# Patient Record
Sex: Male | Born: 2000
Health system: Southern US, Community
[De-identification: ages and names within clinical notes are randomized; demographics above are authoritative.]

## PROBLEM LIST (undated history)

## (undated) DIAGNOSIS — K30 Functional dyspepsia: Secondary | ICD-10-CM

---
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
## 2022-12-10 ENCOUNTER — Ambulatory Visit: Payer: BC Managed Care – PPO | Admitting: Adult Health

## 2022-12-10 ENCOUNTER — Encounter: Payer: Self-pay | Admitting: Adult Health

## 2022-12-10 VITALS — HR 135 | Temp 98.9°F | Ht 63.0 in | Wt 127.0 lb

## 2022-12-10 DIAGNOSIS — R300 Dysuria: Secondary | ICD-10-CM | POA: Diagnosis not present

## 2022-12-10 DIAGNOSIS — R1084 Generalized abdominal pain: Secondary | ICD-10-CM | POA: Diagnosis not present

## 2022-12-10 LAB — POCT URINALYSIS DIPSTICK (MANUAL)
Leukocytes, UA: NEGATIVE
Nitrite, UA: NEGATIVE
Poct Bilirubin: NEGATIVE
Poct Glucose: NORMAL mg/dL
Poct Ketones: NEGATIVE
Poct Protein: 30 mg/dL — AB
Poct Urobilinogen: NORMAL mg/dL
Spec Grav, UA: 1.025 (ref 1.010–1.025)
pH, UA: 6 (ref 5.0–8.0)

## 2022-12-10 NOTE — Progress Notes (Unsigned)
[facility] [address] [place], [place] [address] Phone: [phone] Fax: [phone]   Office Visit Note  Patient Name: Dylan Jimenez  Date of Birth:2000-05-27  Med Rec number [id number]  Date of Service: 12/12/2022  Patient has no known allergies.  Chief Complaint  Patient presents with   Acute Visit     HPI  Patient reports lower abdominal pain as well as epigastric pain that is intermittent with having bowel movements. He also reports some discomfort with urination, and noticed some blood at the very end of urination. It happened last week, and before that it was a month ago.   He reports 2 BM's a day, regular consistency.  No diarrhea, no nausea or vomiting. Has noticed some faint blood on tissue when wiping.   He is currently sexually active with one male partner, been together over a year. They do not engage in oral or penetrative sex. They are using condoms. He has never been tested for STI's.    Current Medication:  No outpatient encounter medications on file as of 12/10/2022.   No facility-administered encounter medications on file as of 12/10/2022.      Medical History: History reviewed. No pertinent past medical history.   Vital Signs: Pulse (!) 135   Temp 98.9 F (37.2 C) (Tympanic)   Ht 5\' 3"  (1.6 m)   Wt 127 lb (57.6 kg)   SpO2 98%   BMI 22.50 kg/m    Review of Systems  Respiratory:  Negative for cough.   Cardiovascular:  Negative for chest pain.  Gastrointestinal:  Positive for abdominal pain. Negative for diarrhea, nausea and vomiting.  Genitourinary:  Positive for dysuria.    Physical Exam Constitutional:      Appearance: Normal appearance.  HENT:     Head: Normocephalic.     Right Ear: Tympanic membrane normal.     Left Ear: Tympanic membrane normal.     Nose: Nose normal.     Mouth/Throat:     Mouth: Mucous membranes are moist.     Pharynx: No oropharyngeal exudate or posterior oropharyngeal erythema.   Eyes:     General:        Right eye: No discharge.        Left eye: No discharge.     Extraocular Movements: Extraocular movements intact.     Pupils: Pupils are equal, round, and reactive to light.  Cardiovascular:     Rate and Rhythm: Normal rate and regular rhythm.     Pulses: Normal pulses.     Heart sounds: Normal heart sounds. No murmur heard. Pulmonary:     Effort: Pulmonary effort is normal. No respiratory distress.     Breath sounds: Normal breath sounds. No wheezing or rhonchi.  Abdominal:     General: Abdomen is flat. Bowel sounds are normal. There is no distension.     Palpations: There is no mass.     Tenderness: There is no abdominal tenderness. There is no right CVA tenderness, left CVA tenderness, guarding or rebound.     Hernia: No hernia is present.  Musculoskeletal:        General: No swelling or deformity. Normal range of motion.     Cervical back: Normal range of motion.  Skin:    General: Skin is warm and dry.     Capillary Refill: Capillary refill takes less than 2 seconds.  Neurological:     General: No focal deficit present.     Mental Status: He  is alert.     Cranial Nerves: No cranial nerve deficit.     Gait: Gait normal.  Psychiatric:        Mood and Affect: Mood normal.        Behavior: Behavior normal.        Judgment: Judgment normal.    No results found for this or any previous visit (from the past 24 hour(s)).   Assessment/Plan: 1. Generalized abdominal pain Will check labs, encouraged OTC constipation medications to see if that changes the discomfort. .  - CBC w/Diff - Comprehensive metabolic panel - C-reactive protein  2. Dysuria - POCT Urinalysis Dip Manual     General Counseling: Dylan Jimenez verbalizes understanding of the findings of todays visit and agrees with plan of treatment. I have discussed any further diagnostic evaluation that may be needed or ordered today. We also reviewed his medications today. he has been encouraged to  call the office with any questions or concerns that should arise related to todays visit.   Orders Placed This Encounter  Procedures   CBC w/Diff   Comprehensive metabolic panel   C-reactive protein   POCT Urinalysis Dip Manual    No orders of the defined types were placed in this encounter.   Time spent:20 Minutes Time spent includes review of chart, medications, test results, and follow up plan with the patient.    Johnna Acosta AGNP-C Nurse Practitioner

## 2022-12-11 LAB — COMPREHENSIVE METABOLIC PANEL
ALT: 11 [IU]/L (ref 0–44)
AST: 20 [IU]/L (ref 0–40)
Albumin: 4.9 g/dL (ref 4.3–5.2)
Alkaline Phosphatase: 76 [IU]/L (ref 51–125)
BUN/Creatinine Ratio: 13 (ref 9–20)
BUN: 13 mg/dL (ref 6–20)
Bilirubin Total: 0.4 mg/dL (ref 0.0–1.2)
CO2: 24 mmol/L (ref 20–29)
Calcium: 9.7 mg/dL (ref 8.7–10.2)
Chloride: 102 mmol/L (ref 96–106)
Creatinine, Ser: 1.03 mg/dL (ref 0.76–1.27)
Globulin, Total: 2.1 g/dL (ref 1.5–4.5)
Sodium: 142 mmol/L (ref 134–144)
Total Protein: 7 g/dL (ref 6.0–8.5)
eGFR: 107 mL/min/{1.73_m2} (ref >59)

## 2022-12-11 LAB — CBC WITH DIFFERENTIAL/PLATELET
Basophils Absolute: 0.1 10*3/uL (ref 0.0–0.2)
Basos: 1 %
EOS (ABSOLUTE): 0.1 10*3/uL (ref 0.0–0.4)
Eos: 1 %
Hematocrit: 47.6 % (ref 37.5–51.0)
Hemoglobin: 16.2 g/dL (ref 13.0–17.7)
Immature Grans (Abs): 0 10*3/uL (ref 0.0–0.1)
Immature Granulocytes: 1 %
Lymphocytes Absolute: 2.1 10*3/uL (ref 0.7–3.1)
Lymphs: 31 %
MCH: 28.7 pg (ref 26.6–33.0)
MCHC: 34 g/dL (ref 31.5–35.7)
MCV: 84 fL (ref 79–97)
Monocytes Absolute: 0.4 10*3/uL (ref 0.1–0.9)
Monocytes: 7 %
Neutrophils Absolute: 3.9 10*3/uL (ref 1.4–7.0)
Neutrophils: 59 %
Platelets: 346 10*3/uL (ref 150–450)
RBC: 5.64 x10E6/uL (ref 4.14–5.80)
RDW: 12.8 % (ref 11.6–15.4)
WBC: 6.6 10*3/uL (ref 3.4–10.8)

## 2022-12-11 LAB — C-REACTIVE PROTEIN: CRP: 1 mg/L (ref 0–10)

## 2022-12-12 ENCOUNTER — Encounter: Payer: Self-pay | Admitting: Adult Health

## 2023-02-17 ENCOUNTER — Ambulatory Visit: Payer: Self-pay | Admitting: Gastroenterology

## 2023-04-27 ENCOUNTER — Ambulatory Visit
Admission: RE | Admit: 2023-04-27 | Discharge: 2023-04-27 | Discharge status: Home / Self Care | Payer: Self-pay | Source: Ambulatory Visit | Attending: Emergency Medicine

## 2023-04-27 VITALS — BP 131/87 | HR 103 | Temp 98.9°F | Resp 18

## 2023-04-27 DIAGNOSIS — R1084 Generalized abdominal pain: Secondary | ICD-10-CM | POA: Diagnosis not present

## 2023-04-27 DIAGNOSIS — K219 Gastro-esophageal reflux disease without esophagitis: Secondary | ICD-10-CM

## 2023-04-27 MED ORDER — OMEPRAZOLE 20 MG PO CPDR: 20.0000 mg | DELAYED_RELEASE_CAPSULE | Freq: Every day | ORAL | 0 refills | Status: DC

## 2023-04-27 NOTE — ED Provider Notes (Signed)
 [facility]    CSN: [id number] Arrival date & time: 04/27/23  1125      History   Chief Complaint Chief Complaint  Patient presents with   Abdominal Pain    ongoing indigestion with blood in stool,  feeling full quickly, increased gas and burping with food, no appetite, and pain after ingesting any sweets in the upper abdomen. - Entered by patient    HPI Dylan Jimenez is a 23 y.o. male.  Patient presents with 78-month history of abdominal discomfort, bloating, indigestion, decreased appetite.  He took Pepto-Bismol last week; no OTC medications taken today.  No fever, dysuria, hematuria, vomiting, diarrhea, constipation.  Last bowel movement this morning.  Patient was seen at [facility] on 12/10/2022 for generalized abdominal pain and dysuria; urine did not indicate infection and blood work normal.   The history is provided by the patient and medical records.    History reviewed. No pertinent past medical history.  There are no active problems to display for this patient.   History reviewed. No pertinent surgical history.     Home Medications    Prior to Admission medications   Medication Sig Start Date End Date Taking? Authorizing Provider  omeprazole (PRILOSEC) 20 MG capsule Take 1 capsule (20 mg total) by mouth daily for 14 days. 04/27/23 05/11/23 Yes Mickie Bail, NP    Family History History reviewed. No pertinent family history.  Social History Social History   Tobacco Use   Smoking status: Never   Smokeless tobacco: Never  Vaping Use   Vaping status: Never Used  Substance Use Topics   Alcohol use: Never   Drug use: Never     Allergies   Patient has no known allergies.   Review of Systems Review of Systems  Constitutional:  Negative for chills and fever.  Gastrointestinal:  Positive for abdominal pain. Negative for blood in stool, constipation, diarrhea, nausea and vomiting.  Genitourinary:  Negative for dysuria, flank pain  and hematuria.     Physical Exam Triage Vital Signs ED Triage Vitals [04/27/23 1133]  Encounter Vitals Group     BP 131/87     Systolic BP Percentile      Diastolic BP Percentile      Pulse Rate (!) 103     Resp 18     Temp 98.9 F (37.2 C)     Temp src      SpO2 98 %     Weight      Height      Head Circumference      Peak Flow      Pain Score      Pain Loc      Pain Education      Exclude from Growth Chart    No data found.  Updated Vital Signs BP 131/87   Pulse (!) 103   Temp 98.9 F (37.2 C)   Resp 18   SpO2 98%   Visual Acuity Right Eye Distance:   Left Eye Distance:   Bilateral Distance:    Right Eye Near:   Left Eye Near:    Bilateral Near:     Physical Exam Constitutional:      General: He is not in acute distress. HENT:     Mouth/Throat:     Mouth: Mucous membranes are moist.  Cardiovascular:     Rate and Rhythm: Normal rate and regular rhythm.     Heart sounds: Normal heart sounds.  Pulmonary:  Effort: Pulmonary effort is normal. No respiratory distress.     Breath sounds: Normal breath sounds.  Abdominal:     General: Bowel sounds are normal.     Palpations: Abdomen is soft.     Tenderness: There is no abdominal tenderness. There is no right CVA tenderness, left CVA tenderness, guarding or rebound.  Neurological:     Mental Status: He is alert.      UC Treatments / Results  Labs (all labs ordered are listed, but only abnormal results are displayed) Labs Reviewed - No data to display  EKG   Radiology No results found.  Procedures Procedures (including critical care time)  Medications Ordered in UC Medications - No data to display  Initial Impression / Assessment and Plan / UC Course  I have reviewed the triage vital signs and the nursing notes.  Pertinent labs & imaging results that were available during my care of the patient were reviewed by me and considered in my medical decision making (see chart for  details).   Generalized abdominal pain, GERD.  Afebrile and vital signs are stable.  Abdomen is soft and nontender with good bowel sounds.  Treating with 14-day course of omeprazole.  Instructed patient to follow-up with [facility].  ED precautions discussed.  Education provided on abdominal pain and GERD.  He agrees to plan of care.  Final Clinical Impressions(s) / UC Diagnoses   Final diagnoses:  Generalized abdominal pain  Gastroesophageal reflux disease, unspecified whether esophagitis present     Discharge Instructions      Take the omeprazole as directed.    Follow-up with [facility].  Go to the emergency department if you have worsening symptoms.     ED Prescriptions     Medication Sig Dispense Auth. Provider   omeprazole (PRILOSEC) 20 MG capsule Take 1 capsule (20 mg total) by mouth daily for 14 days. 14 capsule Mickie Bail, NP      PDMP not reviewed this encounter.   Mickie Bail, NP 04/27/23 [phone]

## 2023-04-27 NOTE — Discharge Instructions (Addendum)
 Take the omeprazole as directed.    Follow-up with [facility].  Go to the emergency department if you have worsening symptoms.

## 2023-04-27 NOTE — ED Triage Notes (Addendum)
 Patient to Urgent Care with complaints of indigestion/ constipation/ bloating/ gas/ burping after eating/ poor appetite.   Symptoms started 3 months ago. Poor appetite over the last week. Feels like his symptoms are worsening.   No otc meds attempted. Has been avoiding certain foods he believes triggers his symptoms including foods high in sugar.

## 2023-04-30 ENCOUNTER — Encounter: Payer: Self-pay | Admitting: Adult Health

## 2023-04-30 ENCOUNTER — Ambulatory Visit: Admitting: Adult Health

## 2023-04-30 VITALS — BP 138/72 | HR 108 | Temp 98.1°F | Ht 64.0 in | Wt 122.0 lb

## 2023-04-30 DIAGNOSIS — R109 Unspecified abdominal pain: Secondary | ICD-10-CM | POA: Diagnosis not present

## 2023-04-30 DIAGNOSIS — R634 Abnormal weight loss: Secondary | ICD-10-CM | POA: Diagnosis not present

## 2023-04-30 NOTE — Progress Notes (Signed)
 [facility] [address] [place], [place] [address] Phone: [phone] Fax: [phone]   Office Visit Note  Patient Name: Dylan Jimenez  Date of Birth:2000-07-15  Med Rec number [id number]  Date of Service: 04/30/2023  Patient has no known allergies.  Chief Complaint  Patient presents with   Acute Visit     HPI  Patient went to Urgent care 3 days ago, see previous notes.  He describes excess intestinally gas, indigestion, decreased appetite, pain when eating sugar, unable to vomit. He reports he has been on omeprazole for a few days and it has not seemed to help.  He is requesting a GI referral.  Current Medication:  Outpatient Encounter Medications as of 04/30/2023  Medication Sig   omeprazole (PRILOSEC) 20 MG capsule Take 1 capsule (20 mg total) by mouth daily for 14 days.   No facility-administered encounter medications on file as of 04/30/2023.      Medical History: No past medical history on file.   Vital Signs: BP 138/72   Pulse (!) 108   Temp 98.1 F (36.7 C) (Tympanic)   Ht 5\' 4"  (1.626 m)   Wt 122 lb (55.3 kg)   SpO2 98%   BMI 20.94 kg/m    Review of Systems  Constitutional:  Negative for chills, fatigue and fever.  Eyes:  Negative for pain.    Physical Exam Vitals reviewed.  Constitutional:      Appearance: Normal appearance.  HENT:     Head: Normocephalic.  Abdominal:     General: Abdomen is flat. Bowel sounds are normal. There is no distension or abdominal bruit.     Palpations: Abdomen is soft.  Neurological:     Mental Status: He is alert.    Assessment/Plan: 1. Abdominal pain, unspecified abdominal location (Primary) Referral to [facility] for evaluation. Follow up in clinic as needed.   2. Weight loss - Ambulatory referral to Gastroenterology     General Counseling: Ardyth Harps understanding of the findings of todays visit and agrees with plan of treatment. I have discussed any further diagnostic  evaluation that may be needed or ordered today. We also reviewed his medications today. he has been encouraged to call the office with any questions or concerns that should arise related to todays visit.   No orders of the defined types were placed in this encounter.   No orders of the defined types were placed in this encounter.   Time spent:15 Minutes Time spent includes review of chart, medications, test results, and follow up plan with the patient.    Johnna Acosta AGNP-C Nurse Practitioner

## 2023-05-01 ENCOUNTER — Telehealth: Payer: Self-pay

## 2023-05-01 NOTE — Telephone Encounter (Signed)
 PT REQUESTING APPOINTMENT WHEN NEW PT SCHEDULE OPENS

## 2023-05-12 ENCOUNTER — Emergency Department

## 2023-05-12 ENCOUNTER — Encounter: Payer: Self-pay | Admitting: Emergency Medicine

## 2023-05-12 ENCOUNTER — Other Ambulatory Visit: Payer: Self-pay

## 2023-05-12 ENCOUNTER — Inpatient Hospital Stay
Admission: EM | Admit: 2023-05-12 | Discharge: 2023-05-14 | DRG: 440 | Disposition: A | Discharge status: Home / Self Care | Attending: Hospitalist | Admitting: Hospitalist

## 2023-05-12 DIAGNOSIS — K859 Acute pancreatitis without necrosis or infection, unspecified: Secondary | ICD-10-CM | POA: Diagnosis not present

## 2023-05-12 DIAGNOSIS — R748 Abnormal levels of other serum enzymes: Secondary | ICD-10-CM

## 2023-05-12 HISTORY — DX: Functional dyspepsia: K30

## 2023-05-12 LAB — COMPREHENSIVE METABOLIC PANEL WITH GFR
ALT: 23 U/L (ref 0–44)
AST: 24 U/L (ref 15–41)
Albumin: 4.5 g/dL (ref 3.5–5.0)
Alkaline Phosphatase: 75 U/L (ref 38–126)
Anion gap: 8 (ref 5–15)
BUN: 9 mg/dL (ref 6–20)
CO2: 27 mmol/L (ref 22–32)
Calcium: 9.4 mg/dL (ref 8.9–10.3)
Chloride: 104 mmol/L (ref 98–111)
Creatinine, Ser: 0.9 mg/dL (ref 0.61–1.24)
GFR, Estimated: >60 mL/min (ref >60)
Glucose, Bld: 86 mg/dL (ref 70–99)
Potassium: 3.9 mmol/L (ref 3.5–5.1)
Sodium: 139 mmol/L (ref 135–145)
Total Bilirubin: 0.8 mg/dL (ref 0.0–1.2)
Total Protein: 7.7 g/dL (ref 6.5–8.1)

## 2023-05-12 LAB — URINALYSIS, ROUTINE W REFLEX MICROSCOPIC
Bilirubin Urine: NEGATIVE
Glucose, UA: NEGATIVE mg/dL
Hgb urine dipstick: NEGATIVE
Ketones, ur: 20 mg/dL — AB
Leukocytes,Ua: NEGATIVE
Nitrite: NEGATIVE
Protein, ur: NEGATIVE mg/dL
Specific Gravity, Urine: >1.046 — ABNORMAL HIGH (ref 1.005–1.030)
pH: 5 (ref 5.0–8.0)

## 2023-05-12 LAB — CBC
HCT: 42.8 % (ref 39.0–52.0)
Hemoglobin: 14.6 g/dL (ref 13.0–17.0)
MCH: 28.1 pg (ref 26.0–34.0)
MCHC: 34.1 g/dL (ref 30.0–36.0)
MCV: 82.3 fL (ref 80.0–100.0)
Platelets: 288 10*3/uL (ref 150–400)
RBC: 5.2 MIL/uL (ref 4.22–5.81)
RDW: 12.9 % (ref 11.5–15.5)
WBC: 5.6 10*3/uL (ref 4.0–10.5)
nRBC: 0 % (ref 0.0–0.2)

## 2023-05-12 LAB — TRIGLYCERIDES: Triglycerides: 55 mg/dL (ref <150)

## 2023-05-12 LAB — LIPASE, BLOOD: Lipase: 1710 U/L — ABNORMAL HIGH (ref 11–51)

## 2023-05-12 MED ORDER — SODIUM CHLORIDE 0.9 % IV SOLN: INTRAVENOUS | Status: DC

## 2023-05-12 MED ORDER — MORPHINE SULFATE (PF) 2 MG/ML IV SOLN: 2.0000 mg | INTRAVENOUS | Status: DC | PRN

## 2023-05-12 MED ORDER — ONDANSETRON HCL 4 MG/2ML IJ SOLN: 4.0000 mg | Freq: Three times a day (TID) | INTRAMUSCULAR | Status: DC | PRN

## 2023-05-12 MED ORDER — ACETAMINOPHEN 325 MG PO TABS: 650.0000 mg | ORAL_TABLET | Freq: Four times a day (QID) | ORAL | Status: DC | PRN

## 2023-05-12 MED ORDER — IOHEXOL 300 MG/ML  SOLN
80.0000 mL | Freq: Once | INTRAMUSCULAR | Status: AC | PRN
  Administered 2023-05-12: 80 mL via INTRAVENOUS

## 2023-05-12 MED ORDER — SODIUM CHLORIDE 0.9 % IV BOLUS
1000.0000 mL | Freq: Once | INTRAVENOUS | Status: AC
  Administered 2023-05-12: 1000 mL via INTRAVENOUS

## 2023-05-12 MED ORDER — OXYCODONE-ACETAMINOPHEN 5-325 MG PO TABS: 1.0000 | ORAL_TABLET | ORAL | Status: DC | PRN

## 2023-05-12 NOTE — ED Triage Notes (Signed)
 Pt in via POV, reports ongoing abdominal pain x a few weeks, w/ decreased appetite and weight loss.  Denies N/V/D, last BM today.

## 2023-05-12 NOTE — ED Notes (Addendum)
 Pt was encouraged to try and give a urine sample. Pt advised he would see if he could go in a little bit after the fluid bolus has finished. Pt also expressed interest in leaving without his results being read, and he was discouraged from same due to the risk of same.

## 2023-05-12 NOTE — ED Notes (Signed)
 See triage notes. Patient stated he has been dealing with abdominal issues for a while. Constipation, bloody stool, pus in his stool.

## 2023-05-12 NOTE — ED Provider Notes (Signed)
 [facility] Provider Note    Event Date/Time   First MD Initiated Contact with Patient 05/12/23 1721     (approximate)   History   Chief Complaint Abdominal Pain   HPI  Dylan Jimenez is a 23 y.o. male with no significant past medical history who presents to the ED complaining of abdominal pain.  Patient reports that he has had about 1 month of relatively mild pain in his epigastrium which she describes as feeling similar to indigestion.  He reports decreased appetite with the symptoms and has lost about 5 pounds over the past month.  He has occasionally noticed blood when he goes to wipe after having a bowel movement, but has not passed any blood into the toilet bowl.  He reports occasional nausea but has not vomited.  Pain has seemed to get acutely worse over the past 3 days and so he presents to the ED for further evaluation.     Physical Exam   Triage Vital Signs: ED Triage Vitals [05/12/23 1352]  Encounter Vitals Group     BP      Systolic BP Percentile      Diastolic BP Percentile      Pulse      Resp      Temp      Temp src      SpO2      Weight 117 lb (53.1 kg)     Height 5\' 4"  (1.626 m)     Head Circumference      Peak Flow      Pain Score 2     Pain Loc      Pain Education      Exclude from Growth Chart     Most recent vital signs: Vitals:   05/12/23 1727 05/12/23 2213  BP: 126/82 (!) 141/86  Pulse: 87 100  Resp: 16 18  Temp: 97.8 F (36.6 C) 98.4 F (36.9 C)  SpO2: 98% 99%    Constitutional: Alert and oriented. Eyes: Conjunctivae are normal. Head: Atraumatic. Nose: No congestion/rhinnorhea. Mouth/Throat: Mucous membranes are moist.  Cardiovascular: Normal rate, regular rhythm. Grossly normal heart sounds.  2+ radial pulses bilaterally. Respiratory: Normal respiratory effort.  No retractions. Lungs CTAB. Gastrointestinal: Soft and tender to palpation in the epigastrium with no rebound or guarding. No  distention. Musculoskeletal: No lower extremity tenderness nor edema.  Neurologic:  Normal speech and language. No gross focal neurologic deficits are appreciated.    ED Results / Procedures / Treatments   Labs (all labs ordered are listed, but only abnormal results are displayed) Labs Reviewed  LIPASE, BLOOD - Abnormal; Notable for the following components:      Result Value   Lipase 1,710 (*)    All other components within normal limits  URINALYSIS, ROUTINE W REFLEX MICROSCOPIC - Abnormal; Notable for the following components:   Color, Urine YELLOW (*)    APPearance CLEAR (*)    Specific Gravity, Urine >1.046 (*)    Ketones, ur 20 (*)    All other components within normal limits  COMPREHENSIVE METABOLIC PANEL WITH GFR  CBC  TRIGLYCERIDES  URINE DRUG SCREEN, QUALITATIVE ([facility] ONLY)    RADIOLOGY CT abdomen/pelvis reviewed and interpreted by me with no inflammatory changes, focal fluid collections, or dilated bowel loops.  PROCEDURES:  Critical Care performed: No  Procedures   MEDICATIONS ORDERED IN ED: Medications  0.9 %  sodium chloride  infusion (has no administration in time range)  ondansetron  (ZOFRAN ) injection  4 mg (has no administration in time range)  acetaminophen  (TYLENOL ) tablet 650 mg (has no administration in time range)  morphine  (PF) 2 MG/ML injection 2 mg (has no administration in time range)  oxyCODONE -acetaminophen  (PERCOCET/ROXICET) 5-325 MG per tablet 1 tablet (has no administration in time range)  sodium chloride  0.9 % bolus 1,000 mL (0 mLs Intravenous Stopped 05/12/23 2024)  iohexol  (OMNIPAQUE ) 300 MG/ML solution 80 mL (80 mLs Intravenous Contrast Given 05/12/23 1833)     IMPRESSION / MDM / ASSESSMENT AND PLAN / ED COURSE  I reviewed the triage vital signs and the nursing notes.                              23 y.o. male with no significant past medical history who presents to the ED with 1 month of upper abdominal pain with decreased appetite,  acutely worse over the past 3 days.  Patient's presentation is most consistent with acute presentation with potential threat to life or bodily function.  Differential diagnosis includes, but is not limited to, gastritis, GERD, pancreatitis, hepatitis, cholecystitis, biliary colic, bowel obstruction.  Patient nontoxic-appearing and in no acute distress, vital signs are unremarkable.  His abdomen is soft but he does have significant tenderness to palpation in his epigastrium.  Lipase elevated at greater than 1700, remainder of labs are reassuring without significant anemia, leukocytosis, electrolyte abnormality, or AKI.  LFTs are also unremarkable, will further assess with CT imaging and add on triglyceride levels.  Patient declines pain or nausea medication.  CT abdomen/pelvis is negative for acute process, triglycerides are unremarkable.  Case discussed with hospitalist for admission for further management of pancreatitis.      FINAL CLINICAL IMPRESSION(S) / ED DIAGNOSES   Final diagnoses:  Acute pancreatitis without infection or necrosis, unspecified pancreatitis type     Rx / DC Orders   ED Discharge Orders     None        Note:  This document was prepared using Dragon voice recognition software and may include unintentional dictation errors.   Twilla Galea, MD 05/12/23 2308

## 2023-05-12 NOTE — H&P (Signed)
 History and Physical    Dylan Jimenez [id number] DOB: Jul 12, 2000 DOA: 05/12/2023  Referring MD/NP/PA:   PCP: Pcp, No   Patient coming from:  The patient is coming from home.     Chief Complaint: Abdominal pain  HPI: Dylan Jimenez is a 23 y.o. male without significant past medical history, who presents with abdominal pain.  Patient states that he has abdominal pain for more than 4 weeks, which has been persistent.  It is located in the left upper quadrant, sharp, moderate, nonradiating, aggravated by eating food, associated with burping.  He has decreased appetite, 5 pound weight loss recently.  No nausea, vomiting, diarrhea.  No fever or chills.  He noticed little bloody mucus in stool occasionally.  Patient does not have chest pain, cough, SOB.  No symptoms of UTI.  Patient denies drinking alcohol.  No drug use.  Patient was seen by [facility], and started on Protonix  without improvement. Patient was given referral to GI, but has not been seen yet.  Data reviewed independently and ED Course: pt was found to have lipase 1710, WBC 5.6, GFR> 60, negative UA, triglyceride level 55.  Temperature normal, blood pressure 141/86, heart rate of 100, RR 18, oxygen saturation 99% on room air.  Patient is placed in MedSurg bed for observation.  CT abdomen/pelvis: No acute or active process within the abdomen or pelvis.  Pancreas is unremarkable.  No pancreatic ductal dilation or biliary duct dilation.   EKG: I have personally reviewed.  Sinus rhythm, QTc 436, no ischemic change.   Review of Systems:   General: no fevers, chills, has poor appetite, has fatigue and weight loss HEENT: no blurry vision, hearing changes or sore throat Respiratory: no dyspnea, coughing, wheezing CV: no chest pain, no palpitations GI: no nausea, vomiting, has abdominal pain, no diarrhea, constipation GU: no dysuria, burning on urination, increased urinary frequency, hematuria   Ext: no leg edema Neuro: no unilateral weakness, numbness, or tingling, no vision change or hearing loss Skin: no rash, no skin tear. MSK: No muscle spasm, no deformity, no limitation of range of movement in spin Heme: No easy bruising.  Travel history: No recent long distant travel.   Allergy: No Known Allergies  Past Medical History:  Diagnosis Date   Indigestion     History reviewed. No pertinent surgical history.  Social History:  reports that he has never smoked. He has never used smokeless tobacco. He reports that he does not drink alcohol and does not use drugs.  Family History: I have reviewed with the patient about his family medical history, but the patient states that all family members do not have significant medical issue.   Prior to Admission medications   Medication Sig Start Date End Date Taking? Authorizing Provider  omeprazole  (PRILOSEC) 20 MG capsule Take 1 capsule (20 mg total) by mouth daily for 14 days. 04/27/23 05/11/23  Wellington Half, NP    Physical Exam: Vitals:   05/12/23 1352 05/12/23 1726 05/12/23 1727 05/12/23 2213  BP:  126/82 126/82 (!) 141/86  Pulse:  91 87 100  Resp:  18 16 18   Temp:  98.1 F (36.7 C) 97.8 F (36.6 C) 98.4 F (36.9 C)  TempSrc:   Oral Oral  SpO2:  100% 98% 99%  Weight: 53.1 kg     Height: 5\' 4"  (1.626 m)      General: Not in acute distress HEENT:       Eyes: PERRL, EOMI, no jaundice  ENT: No discharge from the ears and nose, no pharynx injection, no tonsillar enlargement.        Neck: No JVD, no bruit, no mass felt. Heme: No neck lymph node enlargement. Cardiac: S1/S2, RRR, No murmurs, No gallops or rubs. Respiratory: No rales, wheezing, rhonchi or rubs. GI: Soft, nondistended, has tenderness in left upper quadrant, no rebound pain, no organomegaly, BS present. GU: No hematuria Ext: No pitting leg edema bilaterally. 1+DP/PT pulse bilaterally. Musculoskeletal: No joint deformities, No joint redness or warmth, no  limitation of ROM in spin. Skin: No rashes.  Neuro: Alert, oriented X3, cranial nerves II-XII grossly intact, moves all extremities normally. Psych: Patient is not psychotic, no suicidal or hemocidal ideation.  Labs on Admission: I have personally reviewed following labs and imaging studies  CBC: Recent Labs  Lab 05/12/23 1435  WBC 5.6  HGB 14.6  HCT 42.8  MCV 82.3  PLT 288   Basic Metabolic Panel: Recent Labs  Lab 05/12/23 1435  NA 139  K 3.9  CL 104  CO2 27  GLUCOSE 86  BUN 9  CREATININE 0.90  CALCIUM 9.4   GFR: Estimated Creatinine Clearance: 98.3 mL/min (by C-G formula based on SCr of 0.9 mg/dL). Liver Function Tests: Recent Labs  Lab 05/12/23 1435  AST 24  ALT 23  ALKPHOS 75  BILITOT 0.8  PROT 7.7  ALBUMIN 4.5   Recent Labs  Lab 05/12/23 1435  LIPASE 1,710*   No results for input(s): "AMMONIA" in the last 168 hours. Coagulation Profile: Recent Labs  Lab 05/13/23 0104  INR 1.1   Cardiac Enzymes: No results for input(s): "CKTOTAL", "CKMB", "CKMBINDEX", "TROPONINI" in the last 168 hours. BNP (last 3 results) No results for input(s): "PROBNP" in the last 8760 hours. HbA1C: No results for input(s): "HGBA1C" in the last 72 hours. CBG: No results for input(s): "GLUCAP" in the last 168 hours. Lipid Profile: Recent Labs    05/12/23 1814  TRIG 55   Thyroid Function Tests: No results for input(s): "TSH", "T4TOTAL", "FREET4", "T3FREE", "THYROIDAB" in the last 72 hours. Anemia Panel: No results for input(s): "VITAMINB12", "FOLATE", "FERRITIN", "TIBC", "IRON", "RETICCTPCT" in the last 72 hours. Urine analysis:    Component Value Date/Time   COLORURINE YELLOW (A) 05/12/2023 2015   APPEARANCEUR CLEAR (A) 05/12/2023 2015   APPEARANCEUR Clear 09/30/2013 1510   LABSPEC >1.046 (H) 05/12/2023 2015   LABSPEC 1.030 09/30/2013 1510   PHURINE 5.0 05/12/2023 2015   GLUCOSEU NEGATIVE 05/12/2023 2015   GLUCOSEU Negative 09/30/2013 1510   HGBUR NEGATIVE  05/12/2023 2015   BILIRUBINUR NEGATIVE 05/12/2023 2015   BILIRUBINUR Negative 09/30/2013 1510   KETONESUR 20 (A) 05/12/2023 2015   PROTEINUR NEGATIVE 05/12/2023 2015   NITRITE NEGATIVE 05/12/2023 2015   LEUKOCYTESUR NEGATIVE 05/12/2023 2015   LEUKOCYTESUR Negative 09/30/2013 1510   Sepsis Labs: @LABRCNTIP (procalcitonin:4,lacticidven:4) )No results found for this or any previous visit (from the past 240 hours).   Radiological Exams on Admission:   Assessment/Plan Principal Problem:   Acute pancreatitis   Assessment and Plan:  Acute pancreatitis: Lipase 1710.  CT scan unremarkable, no pancreatic ductal dilation, no biliary ductal dilation.  Etiology is not clear.  Patient denies drinking alcohol.  No drug use.  -Place in MedSurg bed for observation - Supportive care - Pain control: As needed morphine , Percocet, Tylenol  - As needed Zofran  for nausea - IV fluid: 1 L normal saline, then 150 cc/h - Check IgG4 level - Patient will need to follow-up with GI  DVT ppx: SCd  Code Status: Full code    Family Communication:     not done, no family member is at bed side.     Disposition Plan:  Anticipate discharge back to previous environment  Consults called:  none  Admission status and Level of care: Med-Surg:    for obs    Dispo: The patient is from: Home              Anticipated d/c is to: Home              Anticipated d/c date is: 1 day              Patient currently is not medically stable to d/c.    Severity of Illness:  The appropriate patient status for this patient is OBSERVATION. Observation status is judged to be reasonable and necessary in order to provide the required intensity of service to ensure the patient's safety. The patient's presenting symptoms, physical exam findings, and initial radiographic and laboratory data in the context of their medical condition is felt to place them at decreased risk for further clinical deterioration. Furthermore, it is  anticipated that the patient will be medically stable for discharge from the hospital within 2 midnights of admission.        Date of Service 05/13/2023    Fidencio Hue [facility]   If 7PM-7AM, please contact night-coverage [email or web address] 05/13/2023, 2:22 AM

## 2023-05-12 NOTE — ED Provider Triage Note (Signed)
 Emergency Medicine Provider Triage Evaluation Note  Dylan Jimenez , a 23 y.o. male  was evaluated in triage.  Pt complains of chronic abd pain, worsening over last month, bloody stools, upper abdominal pain and weight loss.  Review of Systems  Positive:  Negative:   Physical Exam  Ht 5\' 4"  (1.626 m)   Wt 53.1 kg   BMI 20.08 kg/m  Gen:   Awake, no distress   Resp:  Normal effort  MSK:   Moves extremities without difficulty  Other:    Medical Decision Making  Medically screening exam initiated at 1:58 PM.  Appropriate orders placed.  Ryman Rathgeber was informed that the remainder of the evaluation will be completed by another provider, this initial triage assessment does not replace that evaluation, and the importance of remaining in the ED until their evaluation is complete.     Delsie Figures, PA-C 05/12/23 1359

## 2023-05-13 ENCOUNTER — Encounter: Payer: Self-pay | Admitting: Internal Medicine

## 2023-05-13 DIAGNOSIS — R748 Abnormal levels of other serum enzymes: Secondary | ICD-10-CM | POA: Diagnosis not present

## 2023-05-13 DIAGNOSIS — K859 Acute pancreatitis without necrosis or infection, unspecified: Secondary | ICD-10-CM | POA: Diagnosis present

## 2023-05-13 LAB — CBC
HCT: 38.5 % — ABNORMAL LOW (ref 39.0–52.0)
Hemoglobin: 12.7 g/dL — ABNORMAL LOW (ref 13.0–17.0)
MCH: 27.4 pg (ref 26.0–34.0)
MCHC: 33 g/dL (ref 30.0–36.0)
MCV: 83 fL (ref 80.0–100.0)
Platelets: 268 10*3/uL (ref 150–400)
RBC: 4.64 MIL/uL (ref 4.22–5.81)
RDW: 13 % (ref 11.5–15.5)
WBC: 6.2 10*3/uL (ref 4.0–10.5)
nRBC: 0 % (ref 0.0–0.2)

## 2023-05-13 LAB — URINE DRUG SCREEN, QUALITATIVE (ARMC ONLY)
Amphetamines, Ur Screen: NOT DETECTED
Barbiturates, Ur Screen: NOT DETECTED
Benzodiazepine, Ur Scrn: NOT DETECTED
Cannabinoid 50 Ng, Ur ~~LOC~~: NOT DETECTED
Cocaine Metabolite,Ur ~~LOC~~: NOT DETECTED
MDMA (Ecstasy)Ur Screen: NOT DETECTED
Methadone Scn, Ur: NOT DETECTED
Opiate, Ur Screen: NOT DETECTED
Phencyclidine (PCP) Ur S: NOT DETECTED
Tricyclic, Ur Screen: NOT DETECTED

## 2023-05-13 LAB — BASIC METABOLIC PANEL WITH GFR
Anion gap: 8 (ref 5–15)
BUN: 8 mg/dL (ref 6–20)
CO2: 20 mmol/L — ABNORMAL LOW (ref 22–32)
Calcium: 8.6 mg/dL — ABNORMAL LOW (ref 8.9–10.3)
Chloride: 105 mmol/L (ref 98–111)
Creatinine, Ser: 0.93 mg/dL (ref 0.61–1.24)
GFR, Estimated: >60 mL/min (ref >60)
Glucose, Bld: 90 mg/dL (ref 70–99)
Potassium: 4 mmol/L (ref 3.5–5.1)
Sodium: 133 mmol/L — ABNORMAL LOW (ref 135–145)

## 2023-05-13 LAB — LIPASE, BLOOD: Lipase: 1749 U/L — ABNORMAL HIGH (ref 11–51)

## 2023-05-13 LAB — PROTIME-INR
INR: 1.1 (ref 0.8–1.2)
Prothrombin Time: 14 s (ref 11.4–15.2)

## 2023-05-13 LAB — APTT: aPTT: 33 s (ref 24–36)

## 2023-05-13 LAB — HIV ANTIBODY (ROUTINE TESTING W REFLEX): HIV Screen 4th Generation wRfx: NONREACTIVE

## 2023-05-13 LAB — CBG MONITORING, ED: Glucose-Capillary: 75 mg/dL (ref 70–99)

## 2023-05-13 MED ORDER — PANTOPRAZOLE SODIUM 40 MG PO TBEC
40.0000 mg | DELAYED_RELEASE_TABLET | Freq: Every day | ORAL | Status: DC
  Administered 2023-05-13: 40 mg via ORAL
  Filled 2023-05-13 (×2): qty 1

## 2023-05-13 MED ORDER — SODIUM CHLORIDE 0.9 % IV SOLN: INTRAVENOUS | Status: DC

## 2023-05-13 NOTE — Plan of Care (Signed)
  Problem: Education: Goal: Knowledge of General Education information will improve Description: Including pain rating scale, medication(s)/side effects and non-pharmacologic comfort measures 05/13/2023 1623 by Devoria Font, RN Outcome: Progressing 05/13/2023 1216 by Devoria Font, RN Outcome: Progressing   Problem: Health Behavior/Discharge Planning: Goal: Ability to manage health-related needs will improve 05/13/2023 1623 by Devoria Font, RN Outcome: Progressing 05/13/2023 1216 by Devoria Font, RN Outcome: Progressing   Problem: Clinical Measurements: Goal: Ability to maintain clinical measurements within normal limits will improve 05/13/2023 1623 by Devoria Font, RN Outcome: Progressing 05/13/2023 1216 by Devoria Font, RN Outcome: Progressing Goal: Will remain free from infection 05/13/2023 1623 by Devoria Font, RN Outcome: Progressing 05/13/2023 1216 by Devoria Font, RN Outcome: Progressing Goal: Diagnostic test results will improve 05/13/2023 1623 by Devoria Font, RN Outcome: Progressing 05/13/2023 1216 by Devoria Font, RN Outcome: Progressing Goal: Respiratory complications will improve 05/13/2023 1623 by Devoria Font, RN Outcome: Progressing 05/13/2023 1216 by Devoria Font, RN Outcome: Progressing Goal: Cardiovascular complication will be avoided 05/13/2023 1623 by Devoria Font, RN Outcome: Progressing 05/13/2023 1216 by Devoria Font, RN Outcome: Progressing   Problem: Activity: Goal: Risk for activity intolerance will decrease 05/13/2023 1623 by Devoria Font, RN Outcome: Progressing 05/13/2023 1216 by Devoria Font, RN Outcome: Progressing   Problem: Nutrition: Goal: Adequate nutrition will be maintained 05/13/2023 1623 by Devoria Font, RN Outcome: Progressing 05/13/2023 1216 by Devoria Font, RN Outcome: Progressing   Problem: Coping: Goal: Level of anxiety will decrease 05/13/2023 1623 by Devoria Font, RN Outcome:  Progressing 05/13/2023 1216 by Devoria Font, RN Outcome: Progressing   Problem: Elimination: Goal: Will not experience complications related to bowel motility 05/13/2023 1623 by Devoria Font, RN Outcome: Progressing 05/13/2023 1216 by Devoria Font, RN Outcome: Progressing Goal: Will not experience complications related to urinary retention 05/13/2023 1623 by Devoria Font, RN Outcome: Progressing 05/13/2023 1216 by Devoria Font, RN Outcome: Progressing   Problem: Pain Managment: Goal: General experience of comfort will improve and/or be controlled 05/13/2023 1623 by Devoria Font, RN Outcome: Progressing 05/13/2023 1216 by Devoria Font, RN Outcome: Progressing   Problem: Safety: Goal: Ability to remain free from injury will improve 05/13/2023 1623 by Devoria Font, RN Outcome: Progressing 05/13/2023 1216 by Devoria Font, RN Outcome: Progressing   Problem: Skin Integrity: Goal: Risk for impaired skin integrity will decrease 05/13/2023 1623 by Devoria Font, RN Outcome: Progressing 05/13/2023 1216 by Devoria Font, RN Outcome: Progressing

## 2023-05-13 NOTE — Progress Notes (Signed)
  PROGRESS NOTE    Dylan Jimenez  UJW:[id number] DOB: 02-Nov-2000 DOA: 05/12/2023 PCP: Pcp, No  [id number]/[id number]  LOS: 0 days   Brief hospital course:   Assessment & Plan: Dylan Jimenez is a 23 y.o. male without significant past medical history, who presents with abdominal pain.   Patient states that he has abdominal pain for more than 4 weeks, which has been persistent.  It is located in the left upper quadrant, sharp, moderate, nonradiating, aggravated by eating food, associated with burping.  He has decreased appetite, 5 pound weight loss recently.    Acute pancreatitis:  Lipase 1710.  CT scan unremarkable, no pancreatic ductal dilation, no biliary ductal dilation.  Etiology is not clear.  Patient denies drinking alcohol.  No drug use. --cont MIVF --advance to full liquid diet   DVT prophylaxis: SCD/Compression stockings Code Status: Full code  Family Communication:  Level of care: Med-Surg Dispo:   The patient is from: home Anticipated d/c is to: home Anticipated d/c date is: 1-2 days   Subjective and Interval History:  Pt still had abdominal pain, but didn't ask for pain meds.  Pain worse with sugary drinks.   Objective: Vitals:   05/13/23 0600 05/13/23 0630 05/13/23 0730 05/13/23 0957  BP: 116/73 118/81 125/74   Pulse: 67 74 76   Resp:   18   Temp:    98.2 F (36.8 C)  TempSrc:    Oral  SpO2: 99% 99% 98%   Weight:      Height:        Intake/Output Summary (Last 24 hours) at 05/13/2023 1537 Last data filed at 05/13/2023 0857 Gross per 24 hour  Intake 2412.5 ml  Output --  Net 2412.5 ml   Filed Weights   05/12/23 1352  Weight: 53.1 kg    Examination:   Constitutional: NAD, AAOx3 HEENT: conjunctivae and lids normal, EOMI CV: No cyanosis.   RESP: normal respiratory effort, on RA Neuro: II - XII grossly intact.   Psych: Normal mood and affect.  Appropriate judgement and reason   Data Reviewed: I have personally reviewed labs and imaging  studies  Time spent: 35 minutes  Garrison Kanner, MD [facility] If 7PM-7AM, please contact night-coverage 05/13/2023, 3:37 PM

## 2023-05-13 NOTE — Plan of Care (Signed)

## 2023-05-13 NOTE — Plan of Care (Signed)
  Problem: Education: Goal: Knowledge of General Education information will improve Description: Including pain rating scale, medication(s)/side effects and non-pharmacologic comfort measures 05/13/2023 1816 by Devoria Font, RN Outcome: Progressing 05/13/2023 1623 by Devoria Font, RN Outcome: Progressing 05/13/2023 1216 by Devoria Font, RN Outcome: Progressing   Problem: Health Behavior/Discharge Planning: Goal: Ability to manage health-related needs will improve 05/13/2023 1816 by Devoria Font, RN Outcome: Progressing 05/13/2023 1623 by Devoria Font, RN Outcome: Progressing 05/13/2023 1216 by Devoria Font, RN Outcome: Progressing   Problem: Clinical Measurements: Goal: Ability to maintain clinical measurements within normal limits will improve 05/13/2023 1816 by Devoria Font, RN Outcome: Progressing 05/13/2023 1623 by Devoria Font, RN Outcome: Progressing 05/13/2023 1216 by Devoria Font, RN Outcome: Progressing Goal: Will remain free from infection 05/13/2023 1816 by Devoria Font, RN Outcome: Progressing 05/13/2023 1623 by Devoria Font, RN Outcome: Progressing 05/13/2023 1216 by Devoria Font, RN Outcome: Progressing Goal: Diagnostic test results will improve 05/13/2023 1816 by Devoria Font, RN Outcome: Progressing 05/13/2023 1623 by Devoria Font, RN Outcome: Progressing 05/13/2023 1216 by Devoria Font, RN Outcome: Progressing Goal: Respiratory complications will improve 05/13/2023 1816 by Devoria Font, RN Outcome: Progressing 05/13/2023 1623 by Devoria Font, RN Outcome: Progressing 05/13/2023 1216 by Devoria Font, RN Outcome: Progressing Goal: Cardiovascular complication will be avoided 05/13/2023 1816 by Devoria Font, RN Outcome: Progressing 05/13/2023 1623 by Devoria Font, RN Outcome: Progressing 05/13/2023 1216 by Devoria Font, RN Outcome: Progressing   Problem: Activity: Goal: Risk for activity intolerance will decrease 05/13/2023  1816 by Devoria Font, RN Outcome: Progressing 05/13/2023 1623 by Devoria Font, RN Outcome: Progressing 05/13/2023 1216 by Devoria Font, RN Outcome: Progressing   Problem: Nutrition: Goal: Adequate nutrition will be maintained 05/13/2023 1816 by Devoria Font, RN Outcome: Progressing 05/13/2023 1623 by Devoria Font, RN Outcome: Progressing 05/13/2023 1216 by Devoria Font, RN Outcome: Progressing   Problem: Coping: Goal: Level of anxiety will decrease 05/13/2023 1816 by Devoria Font, RN Outcome: Progressing 05/13/2023 1623 by Devoria Font, RN Outcome: Progressing 05/13/2023 1216 by Devoria Font, RN Outcome: Progressing   Problem: Elimination: Goal: Will not experience complications related to bowel motility 05/13/2023 1816 by Devoria Font, RN Outcome: Progressing 05/13/2023 1623 by Devoria Font, RN Outcome: Progressing 05/13/2023 1216 by Devoria Font, RN Outcome: Progressing Goal: Will not experience complications related to urinary retention 05/13/2023 1816 by Devoria Font, RN Outcome: Progressing 05/13/2023 1623 by Devoria Font, RN Outcome: Progressing 05/13/2023 1216 by Devoria Font, RN Outcome: Progressing   Problem: Pain Managment: Goal: General experience of comfort will improve and/or be controlled 05/13/2023 1816 by Devoria Font, RN Outcome: Progressing 05/13/2023 1623 by Devoria Font, RN Outcome: Progressing 05/13/2023 1216 by Devoria Font, RN Outcome: Progressing   Problem: Safety: Goal: Ability to remain free from injury will improve 05/13/2023 1816 by Devoria Font, RN Outcome: Progressing 05/13/2023 1623 by Devoria Font, RN Outcome: Progressing 05/13/2023 1216 by Devoria Font, RN Outcome: Progressing   Problem: Skin Integrity: Goal: Risk for impaired skin integrity will decrease 05/13/2023 1816 by Devoria Font, RN Outcome: Progressing 05/13/2023 1623 by Devoria Font, RN Outcome: Progressing 05/13/2023 1216 by Devoria Font, RN Outcome: Progressing

## 2023-05-14 DIAGNOSIS — K859 Acute pancreatitis without necrosis or infection, unspecified: Secondary | ICD-10-CM | POA: Diagnosis not present

## 2023-05-14 DIAGNOSIS — R748 Abnormal levels of other serum enzymes: Secondary | ICD-10-CM

## 2023-05-14 LAB — LIPASE, BLOOD: Lipase: 1788 U/L — ABNORMAL HIGH (ref 11–51)

## 2023-05-14 LAB — BASIC METABOLIC PANEL WITH GFR
Anion gap: 9 (ref 5–15)
BUN: 6 mg/dL (ref 6–20)
CO2: 23 mmol/L (ref 22–32)
Calcium: 9 mg/dL (ref 8.9–10.3)
Chloride: 107 mmol/L (ref 98–111)
Creatinine, Ser: 0.7 mg/dL (ref 0.61–1.24)
GFR, Estimated: >60 mL/min (ref >60)
Glucose, Bld: 78 mg/dL (ref 70–99)
Potassium: 3.7 mmol/L (ref 3.5–5.1)
Sodium: 139 mmol/L (ref 135–145)

## 2023-05-14 LAB — CBC
HCT: 38.5 % — ABNORMAL LOW (ref 39.0–52.0)
Hemoglobin: 13.1 g/dL (ref 13.0–17.0)
MCH: 27.8 pg (ref 26.0–34.0)
MCHC: 34 g/dL (ref 30.0–36.0)
MCV: 81.7 fL (ref 80.0–100.0)
Platelets: 261 10*3/uL (ref 150–400)
RBC: 4.71 MIL/uL (ref 4.22–5.81)
RDW: 12.9 % (ref 11.5–15.5)
WBC: 5.9 10*3/uL (ref 4.0–10.5)
nRBC: 0 % (ref 0.0–0.2)

## 2023-05-14 LAB — AMYLASE: Amylase: 1440 U/L — ABNORMAL HIGH (ref 28–100)

## 2023-05-14 LAB — GLUCOSE, CAPILLARY: Glucose-Capillary: 67 mg/dL — ABNORMAL LOW (ref 70–99)

## 2023-05-14 LAB — MAGNESIUM: Magnesium: 2.2 mg/dL (ref 1.7–2.4)

## 2023-05-14 LAB — IGG 4: IgG, Subclass 4: 20 mg/dL (ref 2–96)

## 2023-05-14 NOTE — Plan of Care (Signed)

## 2023-05-14 NOTE — Progress Notes (Addendum)
  PROGRESS NOTE    Dylan Jimenez  ZOX:[id number] DOB: [birth year] DOA: 05/12/2023 PCP: Pcp, No  159A/[id number]  LOS: 1 day   Brief hospital course:   Assessment & Plan: Dylan Jimenez is a 23 y.o. male without significant past medical history, who presents with abdominal pain.   Patient states that he has abdominal pain for more than 4 weeks, which has been persistent.  It is located in the left upper quadrant, sharp, moderate, nonradiating, aggravated by eating food, associated with burping.  He has decreased appetite, 5 pound weight loss recently.    Acute pancreatitis:  Lipase 1710.  CT scan unremarkable, no pancreatic ductal dilation, no biliary ductal dilation.  Etiology is not clear.  Patient denies drinking alcohol.  No drug use. --amylase also elevated at 1440 --cont MIVF --GI consult --low-carb diet, per GI   DVT prophylaxis: SCD/Compression stockings Code Status: Full code  Family Communication:  Level of care: Med-Surg Dispo:   The patient is from: home Anticipated d/c is to: home Anticipated d/c date is: 1-2 days   Subjective and Interval History:  Pain still present, but pt didn't need pain med.  Lipase continued to trend up, GI consulted.   Objective: Vitals:   05/13/23 2032 05/14/23 0544 05/14/23 0809 05/14/23 1536  BP: 116/78 108/74 106/69 103/71  Pulse: 80 80 72 88  Resp:  19 16 20   Temp: 98 F (36.7 C) 98 F (36.7 C) 97.7 F (36.5 C) 98.3 F (36.8 C)  TempSrc:  Oral  Oral  SpO2: 100% 99% 100% 100%  Weight:      Height:        Intake/Output Summary (Last 24 hours) at 05/14/2023 1654 Last data filed at 05/14/2023 1043 Gross per 24 hour  Intake 240 ml  Output --  Net 240 ml   Filed Weights   05/12/23 1352  Weight: 53.1 kg    Examination:   Constitutional: NAD, AAOx3 HEENT: conjunctivae and lids normal, EOMI CV: No cyanosis.   RESP: normal respiratory effort, on RA Neuro: II - XII grossly intact.   Psych: Normal mood and  affect.  Appropriate judgement and reason   Data Reviewed: I have personally reviewed labs and imaging studies  No charge note.  Garrison Kanner, MD [facility] If 7PM-7AM, please contact night-coverage 05/14/2023, 4:54 PM

## 2023-05-14 NOTE — Consult Note (Signed)
 Dylan Sink, MD [facility]  [address], [address] [place], [place] [address] Phone: [phone] Fax : [phone]  Consultation  Referring Provider:     Dr. 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  The patient states that he has been feeling poorly for the last month.  He also reports that after he eats sugary foods he has left upper quadrant pain that causes him to have a lot of burping up of gas.  He denies that the abdominal pain radiates to any other areas.  He also denies any worsening with movement but states he feels better when he sitting up in a chair or laying down.  There is no radiation of the pain to his back or between the shoulder blades.  It is not associated with any nausea or vomiting. The patient had a CT scan on admission that showed:  IMPRESSION: No acute or active process within the abdomen or pelvis.  The concern was that the patient's lipase was elevated and has not come down.  The levels have shown:  Component     Latest Ref Rng 05/12/2023 05/13/2023 05/14/2023  Lipase     11 - 51 U/L 1,710 (H)  1,749 (H)  1,788 (H)    His LFTs have been normal and his triglycerides are also normal.  The patient also had a IgG4 sent off that is pending.  He denies ever being told that he had abnormal lipase in the past.  He also denies any family history of pancreatic disorders.  He does report some weight loss due to him having pain when he eats sugary foods.  Therefore he has decreased his p.o. intake.  Past Medical History:  Diagnosis Date   Indigestion     History reviewed. No pertinent surgical history.  Prior to Admission medications   Medication Sig Start Date End Date Taking? Authorizing Provider  omeprazole   (PRILOSEC) 20 MG capsule Take 1 capsule (20 mg total) by mouth daily for 14 days. 04/27/23 05/12/23 Yes Dylan Half, NP    History reviewed. No pertinent family history.   Social History   Tobacco Use   Smoking status: Never   Smokeless tobacco: Never  Vaping Use   Vaping status: Never Used  Substance Use Topics   Alcohol use: Never   Drug use: Never    Allergies as of 05/12/2023   (No Known Allergies)    Review of Systems:    All systems reviewed and negative except where noted in HPI.   Physical Exam:  Vital signs in last 24 hours: Temp:  [97.7 F (36.5 C)-98 F (36.7 C)] 97.7 F (36.5 C) (04/23 0809) Pulse Rate:  [72-80] 72 (04/23 0809) Resp:  [16-20] 16 (04/23 0809) BP: (106-120)/(69-87) 106/69 (04/23 0809) SpO2:  [99 %-100 %] 100 % (04/23 0809) Last BM Date : 05/13/23 General:   Pleasant, cooperative in NAD Head:  Normocephalic and atraumatic. Eyes:   No icterus.   Conjunctiva pink. PERRLA. Ears:  Normal auditory acuity. Neck:  Supple; no masses or thyroidomegaly Lungs: Respirations even and unlabored. Lungs clear to auscultation bilaterally.   No wheezes, crackles, or rhonchi.  Heart:  Regular rate and rhythm;  Without murmur, clicks, rubs or gallops Abdomen:  Soft, nondistended, positive left-sided abdominal pain in the mid clavicular line. Normal bowel sounds. No appreciable masses or hepatomegaly.  No rebound or guarding.  Rectal:  Not performed. Msk:  Symmetrical without gross deformities.    Extremities:  Without edema, cyanosis or clubbing. Neurologic:  Alert and oriented x3;  grossly normal neurologically. Skin:  Intact without significant lesions or rashes. Cervical Nodes:  No significant cervical adenopathy. Psych:  Alert and cooperative. Normal affect.  LAB RESULTS: Recent Labs    05/12/23 1435 05/13/23 0458 05/14/23 0342  WBC 5.6 6.2 5.9  HGB 14.6 12.7* 13.1  HCT 42.8 38.5* 38.5*  PLT 288 268 261   BMET Recent Labs    05/12/23 1435  05/13/23 0458 05/14/23 0342  NA 139 133* 139  K 3.9 4.0 3.7  CL 104 105 107  CO2 27 20* 23  GLUCOSE 86 90 78  BUN 9 8 6   CREATININE 0.90 0.93 0.70  CALCIUM 9.4 8.6* 9.0   LFT Recent Labs    05/12/23 1435  PROT 7.7  ALBUMIN 4.5  AST 24  ALT 23  ALKPHOS 75  BILITOT 0.8   PT/INR Recent Labs    05/13/23 0104  LABPROT 14.0  INR 1.1    STUDIES: CT ABDOMEN PELVIS W CONTRAST Result Date: 05/12/2023 CLINICAL DATA:  Abdominal pain with decreased appetite and weight loss. EXAM: CT ABDOMEN AND PELVIS WITH CONTRAST TECHNIQUE: Multidetector CT imaging of the abdomen and pelvis was performed using the standard protocol following bolus administration of intravenous contrast. RADIATION DOSE REDUCTION: This exam was performed according to the departmental dose-optimization program which includes automated exposure control, adjustment of the mA and/or kV according to patient size and/or use of iterative reconstruction technique. CONTRAST:  80mL OMNIPAQUE  IOHEXOL  300 MG/ML  SOLN COMPARISON:  September 30, 2013 FINDINGS: Lower chest: No acute abnormality. Hepatobiliary: No focal liver abnormality is seen. No gallstones, gallbladder wall thickening, or biliary dilatation. Pancreas: Unremarkable. No pancreatic ductal dilatation or surrounding inflammatory changes. Spleen: Normal in size without focal abnormality. Adrenals/Urinary Tract: Adrenal glands are unremarkable. Kidneys are normal, without renal calculi, focal lesion, or hydronephrosis. Bladder is unremarkable. Stomach/Bowel: Stomach is within normal limits. Appendix appears normal. No evidence of bowel wall thickening, distention, or inflammatory changes. Vascular/Lymphatic: No significant vascular findings are present. No enlarged abdominal or pelvic lymph nodes. Reproductive: Prostate is unremarkable. Other: No abdominal wall hernia or abnormality. No abdominopelvic ascites. Musculoskeletal: No acute or significant osseous findings. IMPRESSION:  No acute or active process within the abdomen or pelvis. Electronically Signed   By: Dylan Jimenez M.D.   On: 05/12/2023 21:50      Impression / Plan:   Assessment: Principal Problem:   Acute pancreatitis   Dylan Jimenez is a 23 y.o. y/o male with isolated increased lipase without criteria for acute pancreatitis which would be 2 out of the 3 of the following, lipase 3-4 times the upper limit of normal, imaging showing pancreatitis and pain characteristic of pancreatitis with epigastric pain radiating between the shoulder blades and better when leaning forward.     Plan:  The patient will have his amylase checked to see if it is elevated also leading to a possible presumption that this is pancreatic in origin.  I will also send off a celiac sprue panel.  The patient will be started on a low carbohydrate diet since he states he tolerates that better.  The patient  has been told that he should follow-up as an outpatient and does not need to stay in the hospital for this isolated increased lipase if he is tolerating a p.o. diet.  The patient has been explained the plan and agrees with it.  Thank you for involving me in the care of this patient.      LOS: 1 day   Dylan Sink, MD, MD. Sylvan Evener 05/14/2023, 12:47 PM,  Pager [phone] 7am-5pm  Check AMION for 5pm -7am coverage and on weekends   Note: This dictation was prepared with Dragon dictation along with smaller phrase technology. Any transcriptional errors that result from this process are unintentional.

## 2023-05-14 NOTE — Discharge Summary (Signed)
 Physician Discharge Summary   Dylan Jimenez  male DOB: 2000-06-13  UJW:[id number]  PCP: Pcp, No  Admit date: 05/12/2023 Discharge date: 05/14/2023  Admitted From: home Disposition:  home CODE STATUS: Full code  Discharge Instructions     Ambulatory referral to Gastroenterology   Complete by: As directed    Please schedule appointment in 1-2 weeks for pancreatitis of unclear etiology.   What is the reason for referral?: Other   Diet Carb Modified   Complete by: As directed    Discharge instructions   Complete by: As directed    Continue to drink lots of fluids.  Follow up with GI as outpatient.  If abdominal pain worsens or you have a fever, please return to the ED. [facility] Course:  For full details, please see H&P, progress notes, consult notes and ancillary notes.  Briefly,  Dylan Jimenez is a 23 y.o. male without significant past medical history, who presented with abdominal pain.   Patient stated that he has abdominal pain for more than 4 weeks, which has been persistent.  It is located in the left upper quadrant, sharp, moderate, nonradiating, aggravated by eating food (especially sugary items).  He has decreased appetite, 5 pound weight loss recently.    Acute pancreatitis:  Lipase 1710.  CT scan unremarkable, no pancreatic ductal dilation, no biliary ductal dilation.  Etiology is not clear.  Patient denies drinking alcohol.  No drug use.  Triglyceride wnl.  No gallstones or gallbladder issues. --amylase also elevated at 1440 --received MIVF, however, lipase did not trend down, therefore GI consulted, who opined pt did not need to stay inpatient if pt is tolerating diet.  Pt tolerated low-carb diet and asked to be discharged.   --will need to follow up with outpatient GI.   Discharge Diagnoses:  Principal Problem:   Acute pancreatitis Active Problems:   Abnormal serum level of lipase   30 Day Unplanned Readmission Risk Score     Flowsheet Row ED to Hosp-Admission (Current) from 05/12/2023 in [facility] (1A)  30 Day Unplanned Readmission Risk Score (%) 7.06 Filed at 05/14/2023 1600       This score is the patient's risk of an unplanned readmission within 30 days of being discharged (0 -100%). The score is based on dignosis, age, lab data, medications, orders, and past utilization.   Low:  0-14.9   Medium: 15-21.9   High: 22-29.9   Extreme: 30 and above         Discharge Instructions:  Allergies as of 05/14/2023   No Known Allergies      Medication List     STOP taking these medications    omeprazole  20 MG capsule Commonly known as: PRILOSEC         Follow-up Information     [name], MD Follow up in 1 week(s).   Specialty: Gastroenterology Contact information: [address] [place]  [place] [address] [phone]                 No Known Allergies   The results of significant diagnostics from this hospitalization (including imaging, microbiology, ancillary and laboratory) are listed below for reference.   Consultations:   Procedures/Studies: CT ABDOMEN PELVIS W CONTRAST Result Date: 05/12/2023 CLINICAL DATA:  Abdominal pain with decreased appetite and weight loss. EXAM: CT ABDOMEN AND PELVIS WITH CONTRAST TECHNIQUE: Multidetector CT imaging of the abdomen and pelvis was performed using the standard protocol  following bolus administration of intravenous contrast. RADIATION DOSE REDUCTION: This exam was performed according to the departmental dose-optimization program which includes automated exposure control, adjustment of the mA and/or kV according to patient size and/or use of iterative reconstruction technique. CONTRAST:  80mL OMNIPAQUE  IOHEXOL  300 MG/ML  SOLN COMPARISON:  September 30, 2013 FINDINGS: Lower chest: No acute abnormality. Hepatobiliary: No focal liver abnormality is seen. No gallstones, gallbladder wall thickening, or biliary  dilatation. Pancreas: Unremarkable. No pancreatic ductal dilatation or surrounding inflammatory changes. Spleen: Normal in size without focal abnormality. Adrenals/Urinary Tract: Adrenal glands are unremarkable. Kidneys are normal, without renal calculi, focal lesion, or hydronephrosis. Bladder is unremarkable. Stomach/Bowel: Stomach is within normal limits. Appendix appears normal. No evidence of bowel wall thickening, distention, or inflammatory changes. Vascular/Lymphatic: No significant vascular findings are present. No enlarged abdominal or pelvic lymph nodes. Reproductive: Prostate is unremarkable. Other: No abdominal wall hernia or abnormality. No abdominopelvic ascites. Musculoskeletal: No acute or significant osseous findings. IMPRESSION: No acute or active process within the abdomen or pelvis. Electronically Signed   By: Virgle Grime M.D.   On: 05/12/2023 21:50      Labs: BNP (last 3 results) No results for input(s): "BNP" in the last 8760 hours. Basic Metabolic Panel: Recent Labs  Lab 05/12/23 1435 05/13/23 0458 05/14/23 0342  NA 139 133* 139  K 3.9 4.0 3.7  CL 104 105 107  CO2 27 20* 23  GLUCOSE 86 90 78  BUN 9 8 6   CREATININE 0.90 0.93 0.70  CALCIUM 9.4 8.6* 9.0  MG  --   --  2.2   Liver Function Tests: Recent Labs  Lab 05/12/23 1435  AST 24  ALT 23  ALKPHOS 75  BILITOT 0.8  PROT 7.7  ALBUMIN 4.5   Recent Labs  Lab 05/12/23 1435 05/13/23 0458 05/14/23 0342 05/14/23 1249  LIPASE 1,710* 1,749* 1,788*  --   AMYLASE  --   --   --  1,440*   No results for input(s): "AMMONIA" in the last 168 hours. CBC: Recent Labs  Lab 05/12/23 1435 05/13/23 0458 05/14/23 0342  WBC 5.6 6.2 5.9  HGB 14.6 12.7* 13.1  HCT 42.8 38.5* 38.5*  MCV 82.3 83.0 81.7  PLT 288 268 261   Cardiac Enzymes: No results for input(s): "CKTOTAL", "CKMB", "CKMBINDEX", "TROPONINI" in the last 168 hours. BNP: Invalid input(s): "POCBNP" CBG: Recent Labs  Lab 05/13/23 0818  05/14/23 0844  GLUCAP 75 67*   D-Dimer No results for input(s): "DDIMER" in the last 72 hours. Hgb A1c No results for input(s): "HGBA1C" in the last 72 hours. Lipid Profile Recent Labs    05/12/23 1814  TRIG 55   Thyroid function studies No results for input(s): "TSH", "T4TOTAL", "T3FREE", "THYROIDAB" in the last 72 hours.  Invalid input(s): "FREET3" Anemia work up No results for input(s): "VITAMINB12", "FOLATE", "FERRITIN", "TIBC", "IRON", "RETICCTPCT" in the last 72 hours. Urinalysis    Component Value Date/Time   COLORURINE YELLOW (A) 05/12/2023 2015   APPEARANCEUR CLEAR (A) 05/12/2023 2015   APPEARANCEUR Clear 09/30/2013 1510   LABSPEC >1.046 (H) 05/12/2023 2015   LABSPEC 1.030 09/30/2013 1510   PHURINE 5.0 05/12/2023 2015   GLUCOSEU NEGATIVE 05/12/2023 2015   GLUCOSEU Negative 09/30/2013 1510   HGBUR NEGATIVE 05/12/2023 2015   BILIRUBINUR NEGATIVE 05/12/2023 2015   BILIRUBINUR Negative 09/30/2013 1510   KETONESUR 20 (A) 05/12/2023 2015   PROTEINUR NEGATIVE 05/12/2023 2015   NITRITE NEGATIVE 05/12/2023 2015   LEUKOCYTESUR NEGATIVE 05/12/2023 2015   LEUKOCYTESUR  Negative 09/30/2013 1510   Sepsis Labs Recent Labs  Lab 05/12/23 1435 05/13/23 0458 05/14/23 0342  WBC 5.6 6.2 5.9   Microbiology No results found for this or any previous visit (from the past 240 hours).   Total time spend on discharging this patient, including the last patient exam, discussing the hospital stay, instructions for ongoing care as it relates to all pertinent caregivers, as well as preparing the medical discharge records, prescriptions, and/or referrals as applicable, is 35 minutes.    Garrison Kanner, MD  [facility] 05/14/2023, 5:59 PM

## 2023-05-14 NOTE — Plan of Care (Signed)

## 2023-05-14 NOTE — Progress Notes (Signed)
 Patient was given verbal and written discharge instructions, he acknowledge understanding and states he will comply, Patient walked self out, no distress or pain noted when leaving the floor.

## 2023-05-15 LAB — GLIADIN ANTIBODIES, SERUM
Antigliadin Abs, IgA: 4 U (ref 0–19)
Gliadin IgG: 3 U (ref 0–19)

## 2023-05-15 LAB — TISSUE TRANSGLUTAMINASE, IGA: Tissue Transglutaminase Ab, IgA: <2 U/mL (ref 0–3)

## 2023-05-16 LAB — RETICULIN ANTIBODIES, IGA W TITER: Reticulin Ab, IgA: NEGATIVE {titer} (ref <2.5)

## 2023-05-30 ENCOUNTER — Encounter: Payer: Self-pay | Admitting: Adult Health

## 2023-05-30 ENCOUNTER — Ambulatory Visit: Admitting: Adult Health

## 2023-05-30 VITALS — HR 105 | Temp 97.1°F

## 2023-05-30 DIAGNOSIS — J029 Acute pharyngitis, unspecified: Secondary | ICD-10-CM

## 2023-05-30 DIAGNOSIS — R109 Unspecified abdominal pain: Secondary | ICD-10-CM | POA: Diagnosis not present

## 2023-05-30 LAB — POCT RAPID STREP A (OFFICE): Rapid Strep A Screen: NEGATIVE

## 2023-05-30 NOTE — Progress Notes (Signed)
 [facility] [address] [place], [place] [address] Phone: [phone] Fax: [phone]   Office Visit Note  Patient Name: Dylan Jimenez  Date of Birth:[birth year]  Med Rec number [id number]  Date of Service: 05/30/2023  Patient has no known allergies.  Chief Complaint  Patient presents with   Acute Visit     HPI  Patient here reporting sore throat intermittently over the last month.  Over the last week he has had increased pain with swallowing.  Describes it as feeling like there are "slits" in his throat.  Denies headache, cough ,fever or chills.    Current Medication:  No outpatient encounter medications on file as of 05/30/2023.   No facility-administered encounter medications on file as of 05/30/2023.      Medical History: Past Medical History:  Diagnosis Date   Indigestion      Vital Signs: Pulse (!) 105   Temp (!) 97.1 F (36.2 C) (Tympanic)   SpO2 98%    Review of Systems  Constitutional:  Positive for fatigue. Negative for chills, diaphoresis and fever.  HENT:  Positive for sore throat.   Eyes:  Negative for pain and itching.    Physical Exam HENT:     Mouth/Throat:      Comments: Redness and ulcerations noted on upper palat as above.    Results for orders placed or performed in visit on 05/30/23 (from the past 24 hours)  POCT rapid strep A     Status: Normal   Collection Time: 05/30/23  2:09 PM  Result Value Ref Range   Rapid Strep A Screen Negative Negative    Assessment/Plan: 1. Sore throat (Primary) Check CBC/EBV at this time. Results in Mychart.  - POCT rapid strep A - CBC with Differential/Platelet - EPSTEIN-BARR VIRUS (EBV) Antibody Profile  2. Abdominal discomfort Recheck lipase as discussed.  - Lipase     General Counseling: Bell verbalizes understanding of the findings of todays visit and agrees with plan of treatment. I have discussed any further diagnostic evaluation that may be needed or ordered today.  We also reviewed his medications today. he has been encouraged to call the office with any questions or concerns that should arise related to todays visit.   Orders Placed This Encounter  Procedures   Lipase   CBC with Differential/Platelet   EPSTEIN-BARR VIRUS (EBV) Antibody Profile   POCT rapid strep A    No orders of the defined types were placed in this encounter.   Time spent:15 Minutes Time spent includes review of chart, medications, test results, and follow up plan with the patient.    Sheria Dills AGNP-C Nurse Practitioner

## 2023-05-31 ENCOUNTER — Encounter: Payer: Self-pay | Admitting: Adult Health

## 2023-05-31 LAB — CBC WITH DIFFERENTIAL/PLATELET
Basophils Absolute: 0.1 10*3/uL (ref 0.0–0.2)
Basos: 2 %
EOS (ABSOLUTE): 0.3 10*3/uL (ref 0.0–0.4)
Eos: 7 %
Hematocrit: 43.5 % (ref 37.5–51.0)
Hemoglobin: 14.7 g/dL (ref 13.0–17.7)
Immature Grans (Abs): 0 10*3/uL (ref 0.0–0.1)
Immature Granulocytes: 0 %
Lymphocytes Absolute: 1.7 10*3/uL (ref 0.7–3.1)
Lymphs: 38 %
MCH: 28.2 pg (ref 26.6–33.0)
MCHC: 33.8 g/dL (ref 31.5–35.7)
MCV: 83 fL (ref 79–97)
Monocytes Absolute: 0.3 10*3/uL (ref 0.1–0.9)
Monocytes: 7 %
Neutrophils Absolute: 2 10*3/uL (ref 1.4–7.0)
Neutrophils: 46 %
Platelets: 321 10*3/uL (ref 150–450)
RBC: 5.22 x10E6/uL (ref 4.14–5.80)
RDW: 12.9 % (ref 11.6–15.4)
WBC: 4.5 10*3/uL (ref 3.4–10.8)

## 2023-05-31 LAB — LIPASE: Lipase: 285 U/L — ABNORMAL HIGH (ref 13–78)

## 2023-05-31 LAB — EPSTEIN-BARR VIRUS (EBV) ANTIBODY PROFILE
EBV NA IgG: <18 U/mL (ref 0.0–17.9)
EBV VCA IgG: 79.7 U/mL — ABNORMAL HIGH (ref 0.0–17.9)
EBV VCA IgM: 84.8 U/mL — ABNORMAL HIGH (ref 0.0–35.9)

## 2023-07-28 ENCOUNTER — Encounter: Payer: Self-pay | Admitting: Gastroenterology

## 2023-07-29 ENCOUNTER — Ambulatory Visit: Admitting: Family Medicine

## 2023-08-06 ENCOUNTER — Encounter: Payer: Self-pay | Admitting: Gastroenterology

## 2023-08-06 ENCOUNTER — Ambulatory Visit
Admission: RE | Admit: 2023-08-06 | Discharge: 2023-08-06 | Disposition: A | Discharge status: Home / Self Care | Attending: Gastroenterology | Admitting: Gastroenterology

## 2023-08-06 ENCOUNTER — Ambulatory Visit: Admitting: Anesthesiology

## 2023-08-06 ENCOUNTER — Encounter
Admission: RE | Disposition: A | Discharge status: Home / Self Care | Payer: Self-pay | Source: Home / Self Care | Attending: Gastroenterology

## 2023-08-06 ENCOUNTER — Other Ambulatory Visit: Payer: Self-pay

## 2023-08-06 DIAGNOSIS — Z5941 Food insecurity: Secondary | ICD-10-CM | POA: Insufficient documentation

## 2023-08-06 DIAGNOSIS — Z5986 Financial insecurity: Secondary | ICD-10-CM | POA: Insufficient documentation

## 2023-08-06 DIAGNOSIS — R1013 Epigastric pain: Secondary | ICD-10-CM | POA: Insufficient documentation

## 2023-08-06 DIAGNOSIS — Z5982 Transportation insecurity: Secondary | ICD-10-CM | POA: Diagnosis not present

## 2023-08-06 HISTORY — PX: ESOPHAGOGASTRODUODENOSCOPY: SHX5428

## 2023-08-06 SURGERY — EGD (ESOPHAGOGASTRODUODENOSCOPY)
Anesthesia: General

## 2023-08-06 MED ORDER — PROPOFOL 10 MG/ML IV BOLUS
INTRAVENOUS | Status: DC | PRN
  Administered 2023-08-06 (×2): 30 mg via INTRAVENOUS
  Administered 2023-08-06: 20 mg via INTRAVENOUS
  Administered 2023-08-06 (×2): 30 mg via INTRAVENOUS
  Administered 2023-08-06: 80 mg via INTRAVENOUS

## 2023-08-06 MED ORDER — SODIUM CHLORIDE 0.9 % IV SOLN: INTRAVENOUS | Status: DC

## 2023-08-06 MED ORDER — LIDOCAINE HCL (CARDIAC) PF 100 MG/5ML IV SOSY
PREFILLED_SYRINGE | INTRAVENOUS | Status: DC | PRN
  Administered 2023-08-06: 60 mg via INTRAVENOUS

## 2023-08-06 MED ORDER — DEXMEDETOMIDINE HCL IN NACL 80 MCG/20ML IV SOLN
INTRAVENOUS | Status: DC | PRN
  Administered 2023-08-06: 12 ug via INTRAVENOUS

## 2023-08-06 NOTE — Anesthesia Preprocedure Evaluation (Signed)
 Anesthesia Evaluation  Patient identified by MRN, date of birth, ID band Patient awake    Reviewed: Allergy & Precautions, H&P , NPO status , Patient's Chart, lab work & pertinent test results, reviewed documented beta blocker date and time   History of Anesthesia Complications Negative for: history of anesthetic complications  Airway Mallampati: I  TM Distance: >3 FB Neck ROM: full    Dental  (+) Dental Advidsory Given, Teeth Intact   Pulmonary neg pulmonary ROS   Pulmonary exam normal breath sounds clear to auscultation       Cardiovascular Exercise Tolerance: Good negative cardio ROS Normal cardiovascular exam Rhythm:regular Rate:Normal     Neuro/Psych negative neurological ROS  negative psych ROS   GI/Hepatic negative GI ROS, Neg liver ROS,,,  Endo/Other  negative endocrine ROS    Renal/GU negative Renal ROS  negative genitourinary   Musculoskeletal   Abdominal   Peds  Hematology negative hematology ROS (+)   Anesthesia Other Findings Past Medical History: No date: Indigestion   Reproductive/Obstetrics negative OB ROS                              Anesthesia Physical Anesthesia Plan  ASA: 1  Anesthesia Plan: General   Post-op Pain Management:    Induction: Intravenous  PONV Risk Score and Plan: 2 and Propofol  infusion and TIVA  Airway Management Planned: Natural Airway and Nasal Cannula  Additional Equipment:   Intra-op Plan:   Post-operative Plan:   Informed Consent: I have reviewed the patients History and Physical, chart, labs and discussed the procedure including the risks, benefits and alternatives for the proposed anesthesia with the patient or authorized representative who has indicated his/her understanding and acceptance.     Dental Advisory Given  Plan Discussed with: Anesthesiologist, CRNA and Surgeon  Anesthesia Plan Comments:           Anesthesia Quick Evaluation

## 2023-08-06 NOTE — Op Note (Signed)
 [facility] Patient Name: Dylan Jimenez Procedure Date: 08/06/2023 11:25 AM MRN: [id number] Account #: [id number] Date of Birth: September 09, 2000 Admit Type: Outpatient Age: 23 Room: [facility] ENDO ROOM 2 Gender: Male Note Status: Finalized Instrument Name: Upper Endoscope [id number] Procedure:             Upper GI endoscopy Indications:           Epigastric abdominal pain, elevated serum lipase of                         unknown etiology Providers:             Corinn Jess Brooklyn MD, MD Referring MD:          Corinn Jess Brooklyn MD, MD (Referring MD), No Local Md,                         MD (Referring MD) Medicines:             General Anesthesia Complications:         No immediate complications. Estimated blood loss: None. Procedure:             Pre-Anesthesia Assessment:                        - Prior to the procedure, a History and Physical was                         performed, and patient medications and allergies were                         reviewed. The patient is competent. The risks and                         benefits of the procedure and the sedation options and                         risks were discussed with the patient. All questions                         were answered and informed consent was obtained.                         Patient identification and proposed procedure were                         verified by the physician, the nurse, the                         anesthesiologist, the anesthetist and the technician                         in the pre-procedure area in the procedure room in the                         endoscopy suite. Mental Status Examination: alert and                         oriented. Airway Examination: normal oropharyngeal  airway and neck mobility. Respiratory Examination:                         clear to auscultation. CV Examination: normal.                         Prophylactic  Antibiotics: The patient does not require                         prophylactic antibiotics. Prior Anticoagulants: The                         patient has taken no anticoagulant or antiplatelet                         agents. ASA Grade Assessment: I - A normal, healthy                         patient. After reviewing the risks and benefits, the                         patient was deemed in satisfactory condition to                         undergo the procedure. The anesthesia plan was to use                         general anesthesia. Immediately prior to                         administration of medications, the patient was                         re-assessed for adequacy to receive sedatives. The                         heart rate, respiratory rate, oxygen saturations,                         blood pressure, adequacy of pulmonary ventilation, and                         response to care were monitored throughout the                         procedure. The physical status of the patient was                         re-assessed after the procedure.                        After obtaining informed consent, the endoscope was                         passed under direct vision. Throughout the procedure,                         the patient's blood pressure, pulse, and oxygen  saturations were monitored continuously. The Endoscope                         was introduced through the mouth, and advanced to the                         second part of duodenum. The upper GI endoscopy was                         accomplished without difficulty. The patient tolerated                         the procedure well. Findings:      The duodenal bulb and second portion of the duodenum were normal.       Biopsies were taken with a cold forceps for histology.      The entire examined stomach was normal. Biopsies were taken with a cold       forceps for histology.      The cardia and gastric  fundus were normal on retroflexion.      The gastroesophageal junction and examined esophagus were normal. Impression:            - Normal duodenal bulb and second portion of the                         duodenum. Biopsied.                        - Normal stomach. Biopsied.                        - Normal gastroesophageal junction and esophagus. Recommendation:        - Await pathology results.                        - Discharge patient to home (with escort).                        - Resume previous diet today.                        - Continue present medications.                        - Return to my office as previously scheduled. Procedure Code(s):     --- Professional ---                        [phone], Esophagogastroduodenoscopy, flexible,                         transoral; with biopsy, single or multiple Diagnosis Code(s):     --- Professional ---                        R10.13, Epigastric pain CPT copyright 2022 American Medical Association. All rights reserved. The codes documented in this report are preliminary and upon coder review may  be revised to meet current compliance requirements. Dr. Corinn Brooklyn Corinn Jess Brooklyn MD, MD 08/06/2023 11:46:39 AM This report has been signed electronically. Number of Addenda: 0 Note Initiated On:  08/06/2023 11:25 AM Estimated Blood Loss:  Estimated blood loss: none.      [facility]

## 2023-08-06 NOTE — H&P (Signed)
 Dylan JONELLE Brooklyn, MD [facility], DHIP [address]  [place], [place] [address]  Main: [phone] Fax:  [phone] Pager: [phone]   Primary Care Physician:  Pcp, No Primary Gastroenterologist:  Dr. Corinn JONELLE Jimenez  Pre-Procedure History & Physical: HPI:  Dylan Jimenez is a 23 y.o. male is here for an endoscopy.   Past Medical History:  Diagnosis Date   Indigestion     History reviewed. No pertinent surgical history.  Prior to Admission medications   Not on File    Allergies as of 07/29/2023   (No Known Allergies)    History reviewed. No pertinent family history.  Social History   Socioeconomic History   Marital status: Single    Spouse name: Not on file   Number of children: Not on file   Years of education: Not on file   Highest education level: Not on file  Occupational History   Not on file  Tobacco Use   Smoking status: Never   Smokeless tobacco: Never  Vaping Use   Vaping status: Never Used  Substance and Sexual Activity   Alcohol use: Never   Drug use: Never   Sexual activity: Not on file  Other Topics Concern   Not on file  Social History Narrative   Not on file   Social Drivers of Health   Financial Resource Strain: Medium Risk (07/29/2023)   Received from [facility]   Overall Financial Resource Strain (CARDIA)    Difficulty of Paying Living Expenses: Somewhat hard  Food Insecurity: Food Insecurity Present (07/29/2023)   Received from [facility]   Hunger Vital Sign    Within the past 12 months, you worried that your food would run out before you got the money to buy more.: Sometimes true    Within the past 12 months, the food you bought just didn't last and you didn't have money to get more.: Sometimes true  Transportation Needs: Unmet Transportation Needs (07/29/2023)   Received from [facility] - Transportation    In the past 12  months, has lack of transportation kept you from medical appointments or from getting medications?: No    Lack of Transportation (Non-Medical): Yes  Physical Activity: Not on file  Stress: Not on file  Social Connections: Unknown (05/13/2023)   Social Connection and Isolation Panel    Frequency of Communication with Friends and Family: Not on file    Frequency of Social Gatherings with Friends and Family: Not on file    Attends Religious Services: Not on file    Active Member of Clubs or Organizations: Not on file    Attends Banker Meetings: Not on file    Marital Status: Never married  Intimate Partner Violence: Not At Risk (05/13/2023)   Humiliation, Afraid, Rape, and Kick questionnaire    Fear of Current or Ex-Partner: No    Emotionally Abused: No    Physically Abused: No    Sexually Abused: No    Review of Systems: See HPI, otherwise negative ROS  Physical Exam: BP 128/85   Pulse 88   Temp (!) 96.6 F (35.9 C) (Temporal)   Wt 50.8 kg   SpO2 100%   BMI 19.22 kg/m  General:   Alert,  pleasant and cooperative in NAD Head:  Normocephalic and atraumatic. Neck:  Supple; no masses or thyromegaly. Lungs:  Clear throughout to auscultation.    Heart:  Regular rate and  rhythm. Abdomen:  Soft, nontender and nondistended. Normal bowel sounds, without guarding, and without rebound.   Neurologic:  Alert and  oriented x4;  grossly normal neurologically.  Impression/Plan: Dylan Jimenez is here for an endoscopy to be performed for Upper abdominal pain, elevated serum lipase   Risks, benefits, limitations, and alternatives regarding  endoscopy have been reviewed with the patient.  Questions have been answered.  All parties agreeable.   Dylan Brooklyn, MD  08/06/2023, 10:13 AM

## 2023-08-06 NOTE — Transfer of Care (Signed)
 Immediate Anesthesia Transfer of Care Note  Patient: Dylan Jimenez  Procedure(s) Performed: EGD (ESOPHAGOGASTRODUODENOSCOPY)  Patient Location: PACU  Anesthesia Type:General  Level of Consciousness: awake, alert , and oriented  Airway & Oxygen Therapy: Patient Spontanous Breathing and Patient connected to nasal cannula oxygen  Post-op Assessment: Report given to RN and Post -op Vital signs reviewed and stable  Post vital signs: Reviewed and stable  Last Vitals:  Vitals Value Taken Time  BP 97/60 08/06/23 11:47  Temp 36.3 C 08/06/23 11:47  Pulse 64 08/06/23 11:48  Resp 19 08/06/23 11:48  SpO2 99 % 08/06/23 11:48  Vitals shown include unfiled device data.  Last Pain:  Vitals:   08/06/23 1147  TempSrc: Temporal  PainSc: Asleep         Complications: No notable events documented.

## 2023-08-06 NOTE — Anesthesia Postprocedure Evaluation (Signed)
 Anesthesia Post Note  Patient: Dylan Jimenez  Procedure(s) Performed: EGD (ESOPHAGOGASTRODUODENOSCOPY)  Patient location during evaluation: Endoscopy Anesthesia Type: General Level of consciousness: awake and alert Pain management: pain level controlled Vital Signs Assessment: post-procedure vital signs reviewed and stable Respiratory status: spontaneous breathing, nonlabored ventilation, respiratory function stable and patient connected to nasal cannula oxygen Cardiovascular status: blood pressure returned to baseline and stable Postop Assessment: no apparent nausea or vomiting Anesthetic complications: no   No notable events documented.   Last Vitals:  Vitals:   08/06/23 1157 08/06/23 1207  BP: (!) 96/55 107/69  Pulse: 63 69  Resp: 20 16  Temp:    SpO2: 98% 99%    Last Pain:  Vitals:   08/06/23 1207  TempSrc:   PainSc: 0-No pain                 Prentice Murphy

## 2023-08-07 LAB — SURGICAL PATHOLOGY

## 2023-08-08 ENCOUNTER — Ambulatory Visit: Payer: Self-pay | Admitting: Gastroenterology

## 2023-08-08 NOTE — Progress Notes (Signed)
 I would like to see him for follow-up appointment, video visit is fine Next available is ok  RV

## 2023-09-18 ENCOUNTER — Ambulatory Visit: Admitting: Gastroenterology

## 2023-11-17 ENCOUNTER — Ambulatory Visit (INDEPENDENT_AMBULATORY_CARE_PROVIDER_SITE_OTHER): Admitting: Adult Health

## 2023-11-17 ENCOUNTER — Encounter: Payer: Self-pay | Admitting: Adult Health

## 2023-11-17 VITALS — BP 118/72 | HR 83 | Temp 97.5°F | Ht 64.0 in | Wt 122.0 lb

## 2023-11-17 DIAGNOSIS — Z029 Encounter for administrative examinations, unspecified: Secondary | ICD-10-CM | POA: Diagnosis not present

## 2023-11-17 NOTE — Progress Notes (Signed)
 [facility] [address] [place], [place] [address] Phone: [phone] Fax: [phone]   Office Visit Note  Patient Name: Dylan Jimenez  Date of Birth:2000-11-27  Med Rec number [id number]  Date of Service: 11/17/2023  Patient has no known allergies.  Chief Complaint  Patient presents with   Annual Exam    Abroad Physical      HPI Patient here for study abroad physical.  He is planning to [place] Jan thru May 2026.  Denies any significant medical history.  No current complaints or issues.    Current Medication:  No outpatient encounter medications on file as of 11/17/2023.   No facility-administered encounter medications on file as of 11/17/2023.      Medical History: Past Medical History:  Diagnosis Date   Indigestion      Vital Signs: BP 118/72   Pulse 83   Temp (!) 97.5 F (36.4 C) (Tympanic)   Ht 5' 4 (1.626 m)   Wt 122 lb (55.3 kg)   SpO2 100%   BMI 20.94 kg/m    Review of Systems  Constitutional:  Negative for activity change, appetite change, chills, fatigue and fever.  HENT:  Negative for congestion, sinus pain, trouble swallowing and voice change.   Eyes:  Negative for pain, discharge and visual disturbance.  Respiratory:  Negative for cough, chest tightness and shortness of breath.   Cardiovascular:  Negative for chest pain and leg swelling.  Gastrointestinal:  Negative for abdominal distention, abdominal pain, constipation and diarrhea.  Musculoskeletal:  Negative for arthralgias, back pain and neck pain.  Skin:  Negative for color change.  Neurological:  Negative for dizziness, weakness and headaches.  Hematological:  Negative for adenopathy.  Psychiatric/Behavioral:  Negative for agitation, confusion and suicidal ideas.     Physical Exam Vitals reviewed.  Constitutional:      Appearance: Normal appearance.  HENT:     Head: Normocephalic.     Right Ear: Tympanic membrane and ear canal normal.     Left Ear:  Tympanic membrane and ear canal normal.     Nose: Nose normal.     Mouth/Throat:     Mouth: Mucous membranes are moist.     Pharynx: No posterior oropharyngeal erythema.  Eyes:     Pupils: Pupils are equal, round, and reactive to light.  Cardiovascular:     Rate and Rhythm: Normal rate.  Pulmonary:     Effort: Pulmonary effort is normal.     Breath sounds: Normal breath sounds.  Lymphadenopathy:     Cervical: No cervical adenopathy.  Neurological:     Mental Status: He is alert.     Assessment/Plan: 1. Encounter for administrative examinations (Primary) No concerns based on exam or history for patient's participation in Study abroad activities.  Form completed, signed and returned to patient at this time.       General Counseling: donzell coller understanding of the findings of todays visit and agrees with plan of treatment. I have discussed any further diagnostic evaluation that may be needed or ordered today. We also reviewed his medications today. he has been encouraged to call the office with any questions or concerns that should arise related to todays visit.   No orders of the defined types were placed in this encounter.   No orders of the defined types were placed in this encounter.   Time spent:15 Minutes Time spent includes review of chart, medications, test results, and follow up plan with the patient.  Juliene DOROTHA Howells AGNP-C Nurse Practitioner

## 2023-11-19 ENCOUNTER — Other Ambulatory Visit (INDEPENDENT_AMBULATORY_CARE_PROVIDER_SITE_OTHER)

## 2023-11-19 DIAGNOSIS — Z23 Encounter for immunization: Secondary | ICD-10-CM | POA: Diagnosis not present
